# Patient Record
Sex: Male | Born: 1990 | Race: Black or African American | Hispanic: No | Marital: Single | Smoking: Current some day smoker
Health system: Southern US, Community
[De-identification: ages and names within clinical notes are randomized; demographics above are authoritative.]

---

## 2020-03-04 ENCOUNTER — Other Ambulatory Visit: Payer: Self-pay

## 2020-03-04 ENCOUNTER — Encounter (HOSPITAL_COMMUNITY): Payer: Self-pay | Admitting: Emergency Medicine

## 2020-03-04 ENCOUNTER — Emergency Department (HOSPITAL_COMMUNITY)
Admission: EM | Admit: 2020-03-04 | Discharge: 2020-03-04 | Disposition: A | Discharge status: Home / Self Care | Payer: Self-pay | Attending: Emergency Medicine | Admitting: Emergency Medicine

## 2020-03-04 DIAGNOSIS — Z5321 Procedure and treatment not carried out due to patient leaving prior to being seen by health care provider: Secondary | ICD-10-CM | POA: Insufficient documentation

## 2020-03-04 DIAGNOSIS — M546 Pain in thoracic spine: Secondary | ICD-10-CM | POA: Insufficient documentation

## 2020-03-04 DIAGNOSIS — M542 Cervicalgia: Secondary | ICD-10-CM | POA: Insufficient documentation

## 2020-03-04 DIAGNOSIS — M25512 Pain in left shoulder: Secondary | ICD-10-CM | POA: Insufficient documentation

## 2020-03-04 NOTE — ED Notes (Signed)
Patient walked out of the ed and out to the outside of ED. Patient is standing waiting on a ride.
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
## 2020-03-09 ENCOUNTER — Emergency Department (HOSPITAL_BASED_OUTPATIENT_CLINIC_OR_DEPARTMENT_OTHER)
Admission: EM | Admit: 2020-03-09 | Discharge: 2020-03-09 | Disposition: A | Discharge status: Home / Self Care | Payer: Self-pay | Attending: Emergency Medicine | Admitting: Emergency Medicine

## 2020-03-09 ENCOUNTER — Emergency Department (HOSPITAL_BASED_OUTPATIENT_CLINIC_OR_DEPARTMENT_OTHER): Payer: Self-pay

## 2020-03-09 ENCOUNTER — Other Ambulatory Visit: Payer: Self-pay

## 2020-03-09 ENCOUNTER — Encounter (HOSPITAL_BASED_OUTPATIENT_CLINIC_OR_DEPARTMENT_OTHER): Payer: Self-pay | Admitting: Emergency Medicine

## 2020-03-09 DIAGNOSIS — S46911A Strain of unspecified muscle, fascia and tendon at shoulder and upper arm level, right arm, initial encounter: Secondary | ICD-10-CM

## 2020-03-09 DIAGNOSIS — S29019A Strain of muscle and tendon of unspecified wall of thorax, initial encounter: Secondary | ICD-10-CM

## 2020-03-09 DIAGNOSIS — M546 Pain in thoracic spine: Secondary | ICD-10-CM | POA: Insufficient documentation

## 2020-03-09 DIAGNOSIS — M542 Cervicalgia: Secondary | ICD-10-CM | POA: Insufficient documentation

## 2020-03-09 DIAGNOSIS — M25511 Pain in right shoulder: Secondary | ICD-10-CM | POA: Insufficient documentation

## 2020-03-09 DIAGNOSIS — F17200 Nicotine dependence, unspecified, uncomplicated: Secondary | ICD-10-CM | POA: Insufficient documentation

## 2020-03-09 DIAGNOSIS — S161XXA Strain of muscle, fascia and tendon at neck level, initial encounter: Secondary | ICD-10-CM

## 2020-03-09 MED ORDER — CYCLOBENZAPRINE HCL 10 MG PO TABS: 10.0000 mg | ORAL_TABLET | Freq: Three times a day (TID) | ORAL | 0 refills | Status: AC | PRN

## 2020-03-09 NOTE — Discharge Instructions (Signed)
Take ibuprofen 600 mg every 6 hours as needed for pain.  Begin taking Flexeril as prescribed as needed for pain not relieved with ibuprofen.  Follow-up with your primary doctor if symptoms or not improving in the next 1 to 2 weeks.

## 2020-03-09 NOTE — ED Notes (Signed)
Patient transported to X-ray 
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
## 2020-03-09 NOTE — ED Notes (Signed)
Discharge instructions discussed with patient. Verbalized understanding. Medications discussed. Departs ED at this time in stable condition.
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
